# Patient Record
Sex: Male | Born: 1955 | Race: White | Hispanic: No | Marital: Single | Smoking: Current every day smoker
Health system: Southern US, Community
[De-identification: ages and names within clinical notes are randomized; demographics above are authoritative.]

---

## 2000-08-03 ENCOUNTER — Encounter: Payer: Self-pay | Admitting: Specialist

## 2000-08-03 ENCOUNTER — Ambulatory Visit (HOSPITAL_COMMUNITY): Admission: RE | Admit: 2000-08-03 | Discharge: 2000-08-03 | Payer: Self-pay | Admitting: Specialist

## 2005-05-20 ENCOUNTER — Ambulatory Visit: Payer: Self-pay | Admitting: Family Medicine

## 2005-06-11 ENCOUNTER — Ambulatory Visit: Payer: Self-pay | Admitting: Family Medicine

## 2005-06-23 ENCOUNTER — Ambulatory Visit: Payer: Self-pay | Admitting: Family Medicine

## 2005-07-05 ENCOUNTER — Ambulatory Visit: Payer: Self-pay | Admitting: Family Medicine

## 2005-07-26 ENCOUNTER — Ambulatory Visit: Payer: Self-pay | Admitting: Family Medicine

## 2005-08-13 ENCOUNTER — Ambulatory Visit: Payer: Self-pay | Admitting: Family Medicine

## 2010-07-18 ENCOUNTER — Inpatient Hospital Stay (INDEPENDENT_AMBULATORY_CARE_PROVIDER_SITE_OTHER)
Admission: RE | Admit: 2010-07-18 | Discharge: 2010-07-18 | Disposition: A | Discharge status: Home / Self Care | Payer: Managed Care, Other (non HMO) | Source: Ambulatory Visit | Attending: Emergency Medicine | Admitting: Emergency Medicine

## 2010-07-18 ENCOUNTER — Encounter: Payer: Self-pay | Admitting: Emergency Medicine

## 2010-07-18 DIAGNOSIS — K047 Periapical abscess without sinus: Secondary | ICD-10-CM

## 2010-07-18 DIAGNOSIS — K089 Disorder of teeth and supporting structures, unspecified: Secondary | ICD-10-CM

## 2010-07-23 NOTE — Assessment & Plan Note (Signed)
Summary: TOOTH INF./WSE   Vital Signs:  Patient Profile:   55 Years Old Male CC:      toothahe Height:     69 inches Weight:      188.8 pounds O2 Sat:      99 % O2 treatment:    Room Air Temp:     98.9 degrees F oral Pulse rate:   66 / minute Resp:     24 per minute BP sitting:   194 / 111  (left arm) Cuff size:   large  Vitals Entered By: Linton Flemings RN (July 18, 2010 4:12 PM)                  Updated Prior Medication List: No Medications Current Allergies: No known allergies History of Present Illness Chief Complaint: toothahe History of Present Illness: Toothache last 2 days.  Has a history of bad teeth, frequent infections and needs pain meds from his PCP.  He was told to call his oral surgeon this week and make an appt.  His pain is throbbing 7-8/10, L side mostly, with swelling.  No F/C/N/V.  For his HTN -- no CP, SOB, HA, dizziness, confusion, weakness, slurred speech.  REVIEW OF SYSTEMS Constitutional Symptoms      Denies fever, chills, night sweats, weight loss, weight gain, and fatigue.  Eyes       Denies change in vision, eye pain, eye discharge, glasses, contact lenses, and eye surgery. Ear/Nose/Throat/Mouth       Denies hearing loss/aids, change in hearing, ear pain, ear discharge, dizziness, frequent runny nose, frequent nose bleeds, sinus problems, sore throat, hoarseness, and tooth pain or bleeding.  Respiratory       Denies dry cough, productive cough, wheezing, shortness of breath, asthma, bronchitis, and emphysema/COPD.  Cardiovascular       Denies murmurs, chest pain, and tires easily with exhertion.    Gastrointestinal       Denies stomach pain, nausea/vomiting, diarrhea, constipation, blood in bowel movements, and indigestion. Genitourniary       Denies painful urination, kidney stones, and loss of urinary control. Neurological       Denies paralysis, seizures, and fainting/blackouts. Musculoskeletal       Denies muscle pain, joint  pain, joint stiffness, decreased range of motion, redness, swelling, muscle weakness, and gout.  Skin       Denies bruising, unusual mles/lumps or sores, and hair/skin or nail changes.  Psych       Denies mood changes, temper/anger issues, anxiety/stress, speech problems, depression, and sleep problems. Other Comments: toothache started this morning and pain has increased. states he is to have several teeth pulled Monday  Physical Exam General appearance: well developed, well nourished, mild distress Ears: normal, no lesions or deformities Nasal: mucosa pink, nonedematous, no septal deviation, turbinates normal Oral/Pharynx: see below Chest/Lungs: no rales, wheezes, or rhonchi bilateral, breath sounds equal without effort Heart: regular rate and  rhythm, no murmur MSE: oriented to time, place, and person Poor dentition.  Multiple cavities and likely abscesses with attached oral hardware.  Swelling L jaw with tenderness. Assessment New Problems: DENTAL PAIN (ICD-525.9) ABSCESS, TOOTH (ICD-522.5)   Plan New Medications/Changes: VICODIN 5-500 MG TABS (HYDROCODONE-ACETAMINOPHEN) 1 by mouth at bedtime as needed for pain  #10 x 0, 07/18/2010, Hoyt Koch MD IBUPROFEN 800 MG TABS (IBUPROFEN) 1 by mouth three times a day as needed for pain  #30 x 0, 07/18/2010, Hoyt Koch MD AUGMENTIN 875-125 MG TABS (AMOXICILLIN-POT CLAVULANATE) 1 by mouth  two times a day for 10 days  #20 x 0, 07/18/2010, Hoyt Koch MD  New Orders: New Patient Level III [phone] Services provided After hours-Weekends-Holidays [99051] Planning Comments:   I strongly urge him to see his oral surgeon to be evaluated for tooth extraction.  Also would like him to follow up with his PCP about his HTN.  Perhaps is just from the pain of the teeth, but may need medication for HTN.  ER precautions given for CP, SOB, dizziness, confusion, weakness, etc.  Will treat dental infx with Augmentin, Ibu 800, and Vicodin at  night only.  Last Vicodin Rx was 2 weeks ago by his PCP.  He usually gets #30 from his PCP monthly (Dr. Suzzette Righter in [place]), so will need to go there for a refill.  If more severe, go to ER.  Avoid hot and cold foods/liquids   The patient and/or caregiver has been counseled thoroughly with regard to medications prescribed including dosage, schedule, interactions, rationale for use, and possible side effects and they verbalize understanding.  Diagnoses and expected course of recovery discussed and will return if not improved as expected or if the condition worsens. Patient and/or caregiver verbalized understanding.  Prescriptions: VICODIN 5-500 MG TABS (HYDROCODONE-ACETAMINOPHEN) 1 by mouth at bedtime as needed for pain  #10 x 0   Entered and Authorized by:   Hoyt Koch MD   Signed by:   Hoyt Koch MD on 07/18/2010   Method used:   Print then Give to Patient   RxID:   [id number] IBUPROFEN 800 MG TABS (IBUPROFEN) 1 by mouth three times a day as needed for pain  #30 x 0   Entered and Authorized by:   Hoyt Koch MD   Signed by:   Hoyt Koch MD on 07/18/2010   Method used:   Print then Give to Patient   RxID:   [id number] AUGMENTIN 875-125 MG TABS (AMOXICILLIN-POT CLAVULANATE) 1 by mouth two times a day for 10 days  #20 x 0   Entered and Authorized by:   Hoyt Koch MD   Signed by:   Hoyt Koch MD on 07/18/2010   Method used:   Print then Give to Patient   RxID:   [id number]   Orders Added: 1)  New Patient Level III [01601] 2)  Services provided After hours-Weekends-Holidays [09323]

## 2013-12-30 ENCOUNTER — Emergency Department
Admission: EM | Admit: 2013-12-30 | Discharge: 2013-12-30 | Disposition: A | Discharge status: Home / Self Care | Payer: Self-pay | Source: Home / Self Care | Attending: Emergency Medicine | Admitting: Emergency Medicine

## 2013-12-30 ENCOUNTER — Emergency Department (INDEPENDENT_AMBULATORY_CARE_PROVIDER_SITE_OTHER): Payer: Self-pay

## 2013-12-30 ENCOUNTER — Encounter: Payer: Self-pay | Admitting: Emergency Medicine

## 2013-12-30 DIAGNOSIS — M25469 Effusion, unspecified knee: Secondary | ICD-10-CM

## 2013-12-30 DIAGNOSIS — M2340 Loose body in knee, unspecified knee: Secondary | ICD-10-CM

## 2013-12-30 DIAGNOSIS — M1711 Unilateral primary osteoarthritis, right knee: Secondary | ICD-10-CM

## 2013-12-30 MED ORDER — HYDROCODONE-ACETAMINOPHEN 5-325 MG PO TABS: 2.0000 | ORAL_TABLET | ORAL | Status: AC | PRN

## 2013-12-30 MED ORDER — MELOXICAM 7.5 MG PO TABS: 7.5000 mg | ORAL_TABLET | Freq: Every day | ORAL | Status: DC

## 2013-12-30 NOTE — ED Provider Notes (Signed)
CSN: [id number]     Arrival date & time 12/30/13  1252 History   First MD Initiated Contact with Patient 12/30/13 1335     Chief Complaint  Patient presents with  . Knee Pain    R   (Consider location/radiation/quality/duration/timing/severity/associated sxs/prior Treatment) Patient is a 58 y.o. male presenting with knee pain. The history is provided by the patient. No language interpreter was used.  Knee Pain Location:  Knee Time since incident:  3 months Injury: no   Knee location:  R knee Pain details:    Quality:  Aching and throbbing   Radiates to:  Does not radiate   Severity:  Moderate   Onset quality:  Gradual   Timing:  Constant   Progression:  Worsening Chronicity:  New Dislocation: no   Foreign body present:  No foreign bodies Prior injury to area:  No Relieved by:  Nothing Worsened by:  Nothing tried Ineffective treatments:  None tried Associated symptoms: no back pain   Risk factors: no concern for non-accidental trauma     History reviewed. No pertinent past medical history. History reviewed. No pertinent past surgical history. History reviewed. No pertinent family history. History  Substance Use Topics  . Smoking status: Current Every Day Smoker -- 0.50 packs/day  . Smokeless tobacco: Never Used  . Alcohol Use: Yes    Review of Systems  Musculoskeletal: Positive for joint swelling and myalgias. Negative for back pain.  All other systems reviewed and are negative.   Allergies  Review of patient's allergies indicates no known allergies.  Home Medications   Prior to Admission medications   Medication Sig Start Date End Date Taking? Authorizing Provider  acetaminophen (TYLENOL) 325 MG tablet Take 650 mg by mouth every 6 (six) hours as needed.   Yes Historical Provider, MD   BP 130/75  Pulse 73  Temp(Src) 98.6 F (37 C) (Oral)  Ht  (1.753 m)  Wt 168 lb (76.204 kg)  BMI 24.80 kg/m2  SpO2 100% Physical Exam  Nursing note and vitals  reviewed. Constitutional: He is oriented to person, place, and time. He appears well-developed and well-nourished.  HENT:  Head: Normocephalic.  Eyes: EOM are normal.  Neck: Normal range of motion.  Pulmonary/Chest: Effort normal.  Abdominal: He exhibits no distension.  Musculoskeletal:  Swollen right knee,  Looks arthritic. Pain with range of motion, nv and ns intact  Neurological: He is alert and oriented to person, place, and time.  Psychiatric: He has a normal mood and affect.    ED Course  Procedures (including critical care time) Labs Review Labs Reviewed - No data to display  Imaging Review Dg Knee Complete 4 Views Right  12/30/2013   CLINICAL DATA:  Right knee pain for 2 months.  No known injury.  EXAM: RIGHT KNEE - COMPLETE 4+ VIEW  COMPARISON:  None.  FINDINGS: No fracture or dislocation. There is a punctate (approximately 0.7 cm) loose body within the posterior aspect of the lateral joint space of the knee. A discrete donor site is not identified. Moderate to severe tricompartmental degenerative change, worse with the medial compartment with joint space loss, articular surface irregularity, subchondral sclerosis and osteophytosis. No evidence of chondrocalcinosis. Small to moderate-sized joint effusion. Vascular calcifications. Regional soft tissues appear normal.  IMPRESSION: 1. Small to moderate-sized joint effusion. Otherwise, no acute findings. 2. Moderate to severe tricompartmental degenerative change of the knee, worse within the medial compartment. 3. Punctate (approximately 0.7 cm) loose body within the posterior lateral aspect  of the knee joint space. A discrete donor site is not identified. Further evaluation with knee MRI could be performed as clinically indicated.   Electronically Signed   By: Simonne Come M.D.   On: 12/30/2013 14:03     MDM   1. Osteoarthritis of right knee, unspecified osteoarthritis type     Pt counseled on loose body and severity of arthritis.    I will treat pt's pain and try meloxicam for inflammation.   Pt referred to Dr. Karie Schwalbe.   He may need MRi.      Lonia Skinner Kent, PA-C 12/30/13 1513

## 2013-12-30 NOTE — Discharge Instructions (Signed)
Arthritis, Nonspecific °Arthritis is inflammation of a joint. This usually means pain, redness, warmth or swelling are present. One or more joints may be involved. There are a number of types of arthritis. Your caregiver may not be able to tell what type of arthritis you have right away. °CAUSES  °The most common cause of arthritis is the wear and tear on the joint (osteoarthritis). This causes damage to the cartilage, which can break down over time. The knees, hips, back and neck are most often affected by this type of arthritis. °Other types of arthritis and common causes of joint pain include: °· Sprains and other injuries near the joint. Sometimes minor sprains and injuries cause pain and swelling that develop hours later. °· Rheumatoid arthritis. This affects hands, feet and knees. It usually affects both sides of your body at the same time. It is often associated with chronic ailments, fever, weight loss and general weakness. °· Crystal arthritis. Gout and pseudo gout can cause occasional acute severe pain, redness and swelling in the foot, ankle, or knee. °· Infectious arthritis. Bacteria can get into a joint through a break in overlying skin. This can cause infection of the joint. Bacteria and viruses can also spread through the blood and affect your joints. °· Drug, infectious and allergy reactions. Sometimes joints can become mildly painful and slightly swollen with these types of illnesses. °SYMPTOMS  °· Pain is the main symptom. °· Your joint or joints can also be red, swollen and warm or hot to the touch. °· You may have a fever with certain types of arthritis, or even feel overall ill. °· The joint with arthritis will hurt with movement. Stiffness is present with some types of arthritis. °DIAGNOSIS  °Your caregiver will suspect arthritis based on your description of your symptoms and on your exam. Testing may be needed to find the type of arthritis: °· Blood and sometimes urine tests. °· X-ray tests  and sometimes CT or MRI scans. °· Removal of fluid from the joint (arthrocentesis) is done to check for bacteria, crystals or other causes. Your caregiver (or a specialist) will numb the area over the joint with a local anesthetic, and use a needle to remove joint fluid for examination. This procedure is only minimally uncomfortable. °· Even with these tests, your caregiver may not be able to tell what kind of arthritis you have. Consultation with a specialist (rheumatologist) may be helpful. °TREATMENT  °Your caregiver will discuss with you treatment specific to your type of arthritis. If the specific type cannot be determined, then the following general recommendations may apply. °Treatment of severe joint pain includes: °· Rest. °· Elevation. °· Anti-inflammatory medication (for example, ibuprofen) may be prescribed. Avoiding activities that cause increased pain. °· Only take over-the-counter or prescription medicines for pain and discomfort as recommended by your caregiver. °· Cold packs over an inflamed joint may be used for 10 to 15 minutes every hour. Hot packs sometimes feel better, but do not use overnight. Do not use hot packs if you are diabetic without your caregiver's permission. °· A cortisone shot into arthritic joints may help reduce pain and swelling. °· Any acute arthritis that gets worse over the next 1 to 2 days needs to be looked at to be sure there is no joint infection. °Long-term arthritis treatment involves modifying activities and lifestyle to reduce joint stress jarring. This can include weight loss. Also, exercise is needed to nourish the joint cartilage and remove waste. This helps keep the muscles   around the joint strong. °HOME CARE INSTRUCTIONS  °· Do not take aspirin to relieve pain if gout is suspected. This elevates uric acid levels. °· Only take over-the-counter or prescription medicines for pain, discomfort or fever as directed by your caregiver. °· Rest the joint as much as  possible. °· If your joint is swollen, keep it elevated. °· Use crutches if the painful joint is in your leg. °· Drinking plenty of fluids may help for certain types of arthritis. °· Follow your caregiver's dietary instructions. °· Try low-impact exercise such as: °¨ Swimming. °¨ Water aerobics. °¨ Biking. °¨ Walking. °· Morning stiffness is often relieved by a warm shower. °· Put your joints through regular range-of-motion. °SEEK MEDICAL CARE IF:  °· You do not feel better in 24 hours or are getting worse. °· You have side effects to medications, or are not getting better with treatment. °SEEK IMMEDIATE MEDICAL CARE IF:  °· You have a fever. °· You develop severe joint pain, swelling or redness. °· Many joints are involved and become painful and swollen. °· There is severe back pain and/or leg weakness. °· You have loss of bowel or bladder control. °Document Released: 05/20/2004 Document Revised: 07/05/2011 Document Reviewed: 06/05/2008 °[employer]® Patient Information ©2015 [employer]. This information is not intended to replace advice given to you by your health care provider. Make sure you discuss any questions you have with your health care provider. ° °

## 2013-12-30 NOTE — ED Notes (Signed)
Pt here for R knee pain he has been experiencing for approx 2 months.  The pain is 8/10 and the knee has been swelling on and off.  He has no known injury and has not had any imaging done.  Takes Tylenol for pain and wears a knee sleeve daily.

## 2014-01-04 NOTE — ED Provider Notes (Signed)
Medical history/examination/treatment/procedure(s) were performed by non-physician provider and as supervising physician I was immediately available for consultation/collaboration.   Lajean Manes, MD 01/04/14 [phone]

## 2014-02-16 ENCOUNTER — Emergency Department
Admission: EM | Admit: 2014-02-16 | Discharge: 2014-02-16 | Disposition: A | Discharge status: Home / Self Care | Payer: Self-pay | Source: Home / Self Care | Attending: Emergency Medicine | Admitting: Emergency Medicine

## 2014-02-16 ENCOUNTER — Encounter: Payer: Self-pay | Admitting: Emergency Medicine

## 2014-02-16 DIAGNOSIS — M1711 Unilateral primary osteoarthritis, right knee: Secondary | ICD-10-CM

## 2014-02-16 DIAGNOSIS — M179 Osteoarthritis of knee, unspecified: Secondary | ICD-10-CM

## 2014-02-16 DIAGNOSIS — M25561 Pain in right knee: Secondary | ICD-10-CM

## 2014-02-16 MED ORDER — MELOXICAM 15 MG PO TABS: 15.0000 mg | ORAL_TABLET | Freq: Every day | ORAL | Status: AC

## 2014-02-16 NOTE — ED Provider Notes (Signed)
CSN: [id number]     Arrival date & time 02/16/14  1400 History   First MD Initiated Contact with Patient 02/16/14 1415     Chief Complaint  Patient presents with  . Knee Pain    HPI Has chronic right knee pain. 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 He recalls no specific injury. Complains of moderate to severe right knee pain chronically, and requests refill of Vicodin , #20 was prescribed by Campbell LernerLeslie Sophia here on 12/30/2013. He specifically requests a rx for Vicodin He states he does not have a PCP and can't afford one. Last month, he was referred to Dr. Benjamin Stainhekkekandam for evaluation of a chronic right knee pain, but he states he couldn't keep that appointment because "I can't afford it"  Denies chest pain, palpitations, shortness of breath, syncope, or focal neurologic symptoms No past medical history on file. No past surgical history on file. No family history on file. History  Substance Use Topics  . Smoking status: Current Every Day Smoker -- 0.50 packs/day  . Smokeless tobacco: Never Used  . Alcohol Use: Yes    Review of Systems  All other systems reviewed and are negative.   Allergies  Review of patient's allergies indicates no known allergies.  Home Medications   Prior to Admission medications   Medication Sig Start Date End Date Taking? Authorizing Provider  acetaminophen (TYLENOL) 325 MG tablet Take 650 mg by mouth every 6 (six) hours as needed.    Historical Provider, MD  HYDROcodone-acetaminophen (NORCO/VICODIN) 5-325 MG per tablet Take 2 tablets by mouth every 4 (four) hours as needed. 12/30/13   Elson AreasLeslie K Sofia, PA-C  meloxicam (MOBIC) 15 MG tablet Take 1 tablet (15 mg total) by mouth daily. As needed for pain and inflammation. Take with food. 02/16/14   Lajean Manesavid Massey, MD   Physical Exam  Nursing note and vitals reviewed. Constitutional: He is oriented to person, place, and time. He appears  well-developed and well-nourished. No distress.  I rechecked BP 164/80  HENT:  Head: Normocephalic and atraumatic.  Eyes: Conjunctivae and EOM are normal. Pupils are equal, round, and reactive to light. No scleral icterus.  Neck: Normal range of motion.  Cardiovascular: Normal rate.   Pulmonary/Chest: Effort normal.  Abdominal: He exhibits no distension.  Musculoskeletal: Normal range of motion.  Chronically swollen, degenerative arthritic right knee with slightly decreased range of motion. Some pain on deep palpation diffusely. Some pain when he tries to do a squat. No instability. No heat or redness or fluctuance or any skin change. No red streaks. Neurovascular distally intact.  Neurological: He is alert and oriented to person, place, and time.  Skin: Skin is warm.  Psychiatric: He has a normal mood and affect.    ED Course  Procedures (including critical care time) Labs Review Labs Reviewed - No data to display  Imaging Review 12/30/2013 CLINICAL DATA: Right knee pain for 2 months. No known injury. EXAM: RIGHT KNEE - COMPLETE 4+ VIEW COMPARISON: None. FINDINGS: No fracture or dislocation. There is a punctate (approximately 0.7 cm) loose body within the posterior aspect of the lateral joint space of the knee. A discrete donor site is not identified. Moderate to severe tricompartmental degenerative change, worse with the medial compartment with joint space loss, articular surface irregularity, subchondral sclerosis and osteophytosis. No evidence of chondrocalcinosis. Small to moderate-sized joint effusion. Vascular calcifications. Regional soft tissues appear normal. IMPRESSION: 1. Small to moderate-sized joint  effusion. Otherwise, no acute findings. 2. Moderate to severe tricompartmental degenerative change of the knee, worse within the medial compartment. 3. Punctate (approximately 0.7 cm) loose body within the posterior lateral aspect of the knee joint space. A discrete donor site is not  identified. Further evaluation with knee MRI could be performed as clinically indicated. Electronically Signed By: Simonne ComeJohn Watts M.D. On: 12/30/2013 14:03     MDM   1. Right knee pain   2. Osteoarthritis of right knee, unspecified osteoarthritis type    chronic right knee pain. [facility].--He's had 11 total prescriptions for hydrocodone/acetaminophen in the past 12 months, and at least 30 prescriptions for tramadol in the past 12 months. I confronted him with this printout and he admits he's needed strong pain medicine for his chronic right knee pain. I explained to him them quite concerned about his controlled medication seeking behavior, and I explained that we will not prescribe him Vicodin or any controlled drugs. I urged him to see an orthopedist and gave him names and phone numbers, and I explained risks of not doing so. Also advised him to establish with a PCP for ongoing preventive care and to recheck BP which is elevated today.--I explained risks of not doing so. New Prescriptions   MELOXICAM (MOBIC) 15 MG TABLET    Take 1 tablet (15 mg total) by mouth daily. As needed for pain and inflammation. Take with food.    Precautions discussed. Red flags discussed. Questions invited and answered. Patient voiced understanding and agreement.     Lajean Manesavid Massey, MD 02/16/14 [phone]

## 2014-02-16 NOTE — ED Notes (Signed)
Reports return of right knee pain; evaluated 12-30-2013 for same with xrays> revealed arthritis. Took tylenol at noon today.

## 2014-02-17 ENCOUNTER — Telehealth: Payer: Self-pay | Admitting: Family Medicine

## 2014-02-17 MED ORDER — DICLOFENAC SODIUM 75 MG PO TBEC: 75.0000 mg | DELAYED_RELEASE_TABLET | Freq: Two times a day (BID) | ORAL | Status: AC | PRN

## 2014-02-17 NOTE — ED Notes (Signed)
Patient called noting that Mobic is not effective.  Review of chart shows multiple rx for narcotics and note from yesterday shows a conversation stating no more narcotics.  Will switch to Voltaren. F/u with PCP RN to contact pt.   Rodolph BongEvan S Obediah Welles, MD 02/17/14 [phone]

## 2015-08-25 IMAGING — CR DG KNEE COMPLETE 4+V*R*
4 series · 4 of 4 positions shown · non-contrast
Comparison: None.

CLINICAL DATA: Right knee pain for 2 months.  No known injury.

EXAM:
RIGHT KNEE - COMPLETE 4+ VIEW

[view not recorded (1 of 4)]
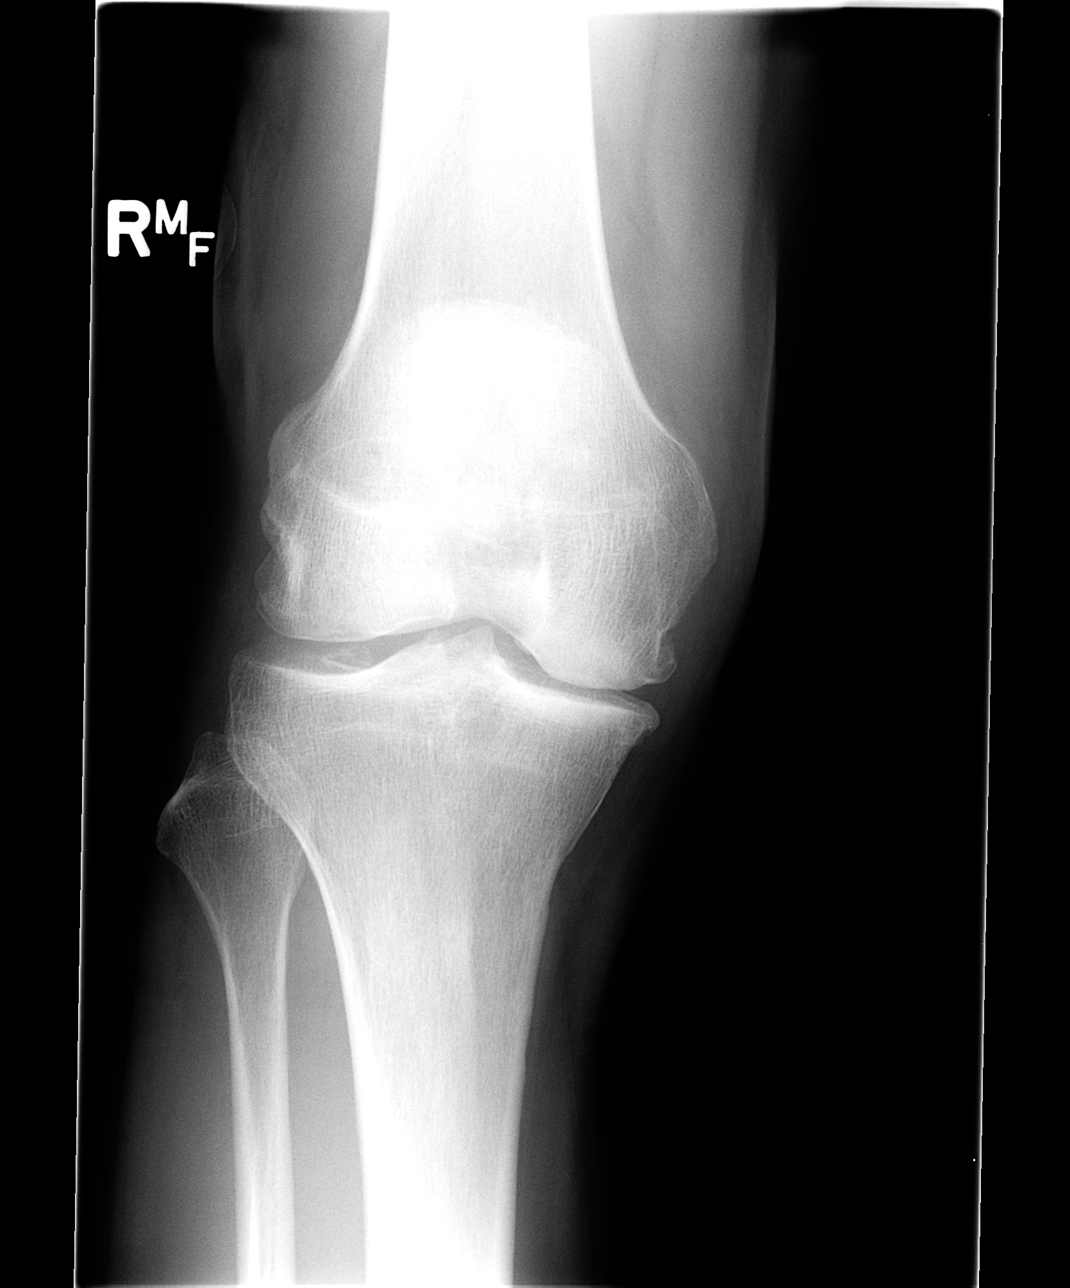

[view not recorded (2 of 4)]
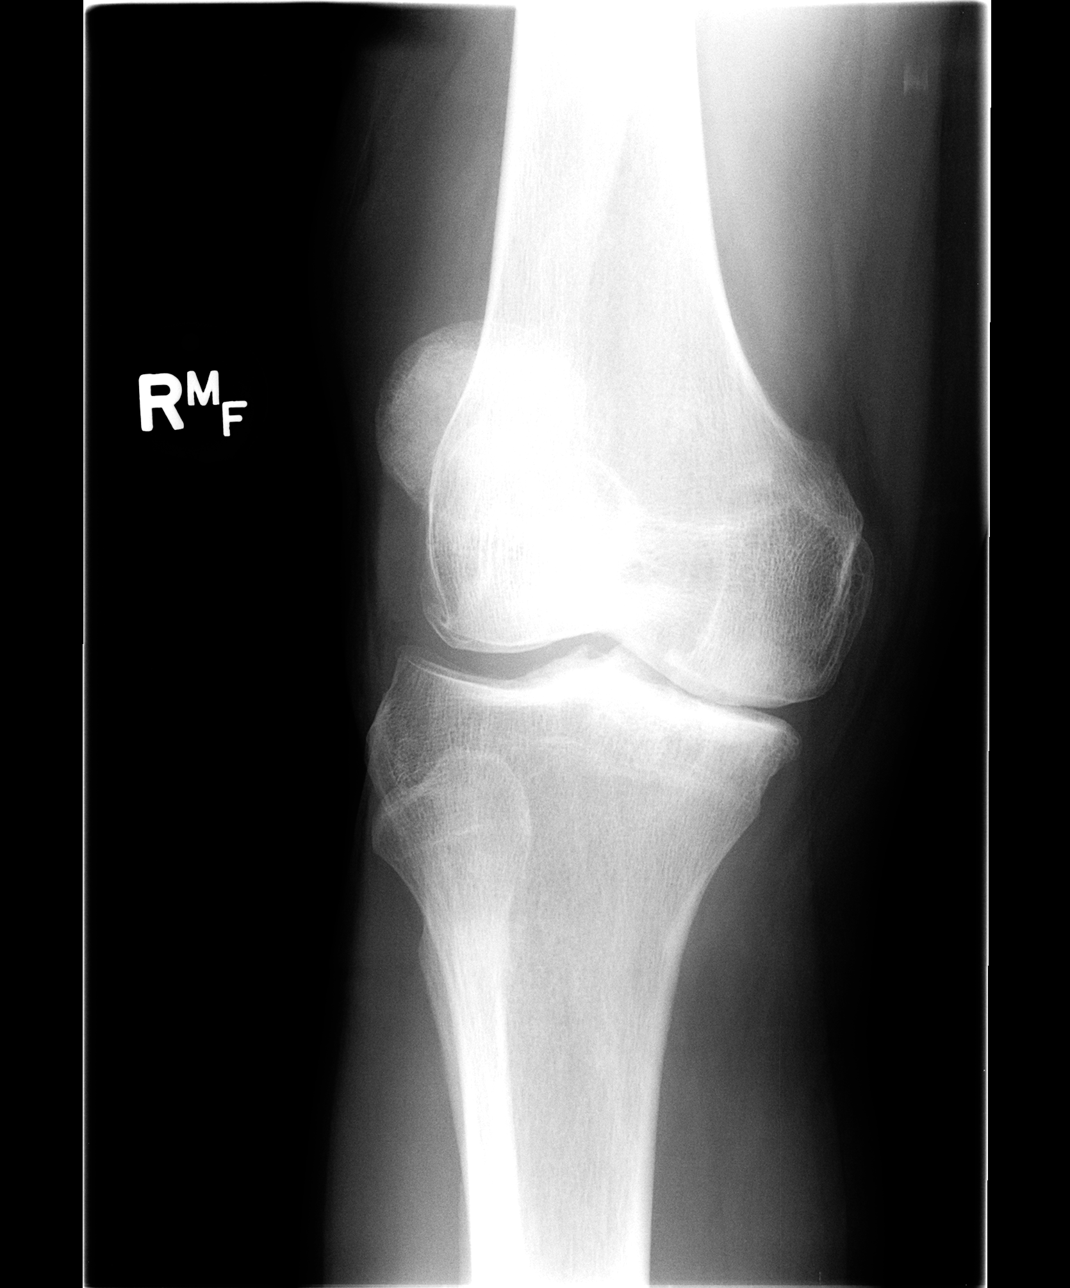

[view not recorded (3 of 4)]
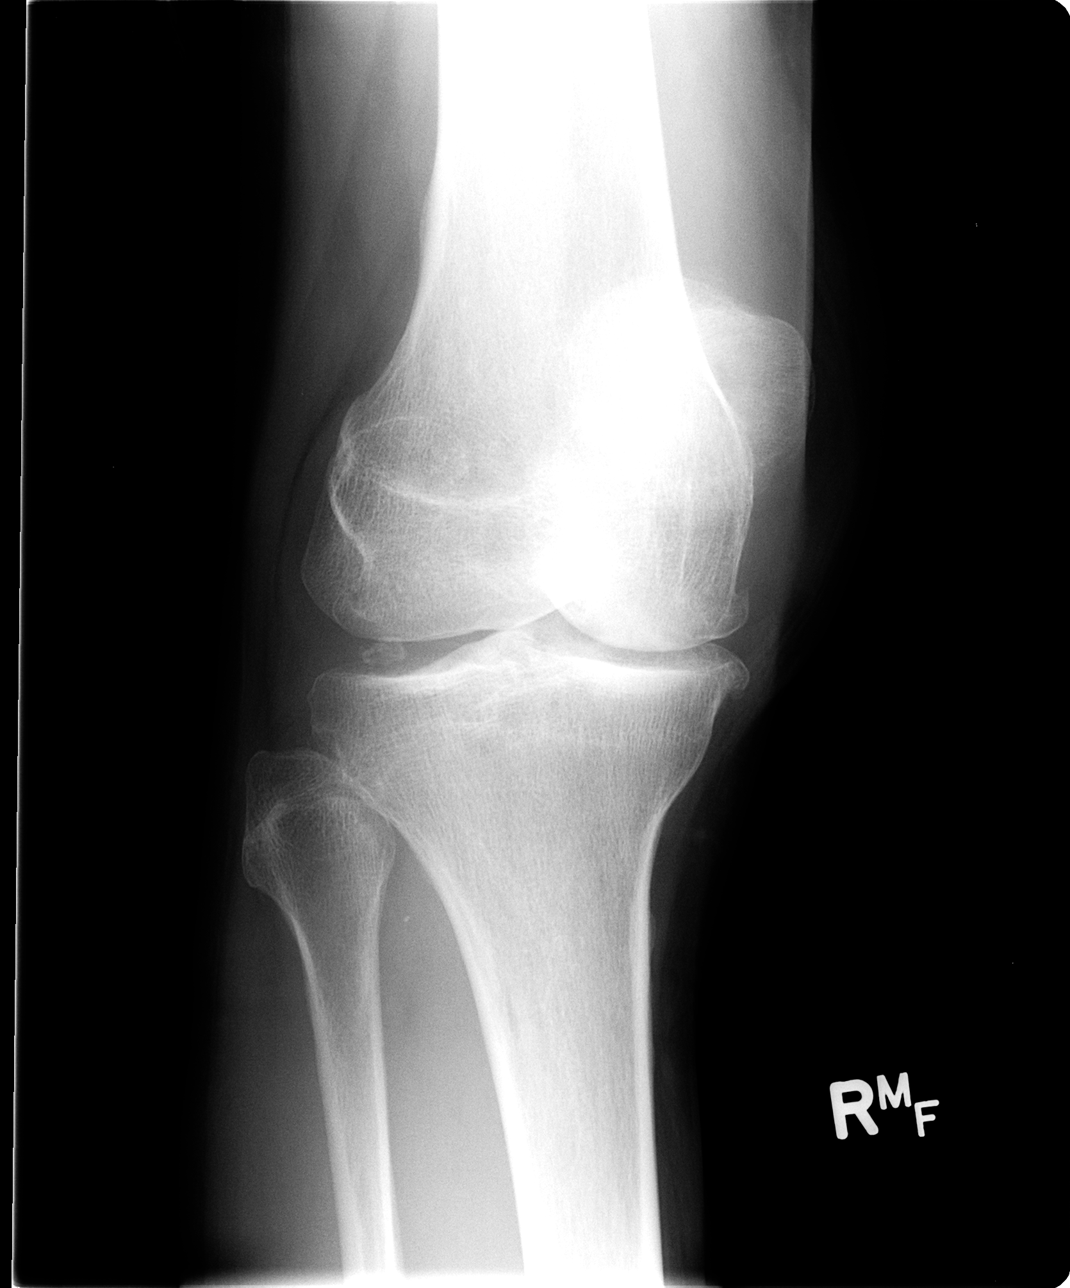

[view not recorded (4 of 4)]
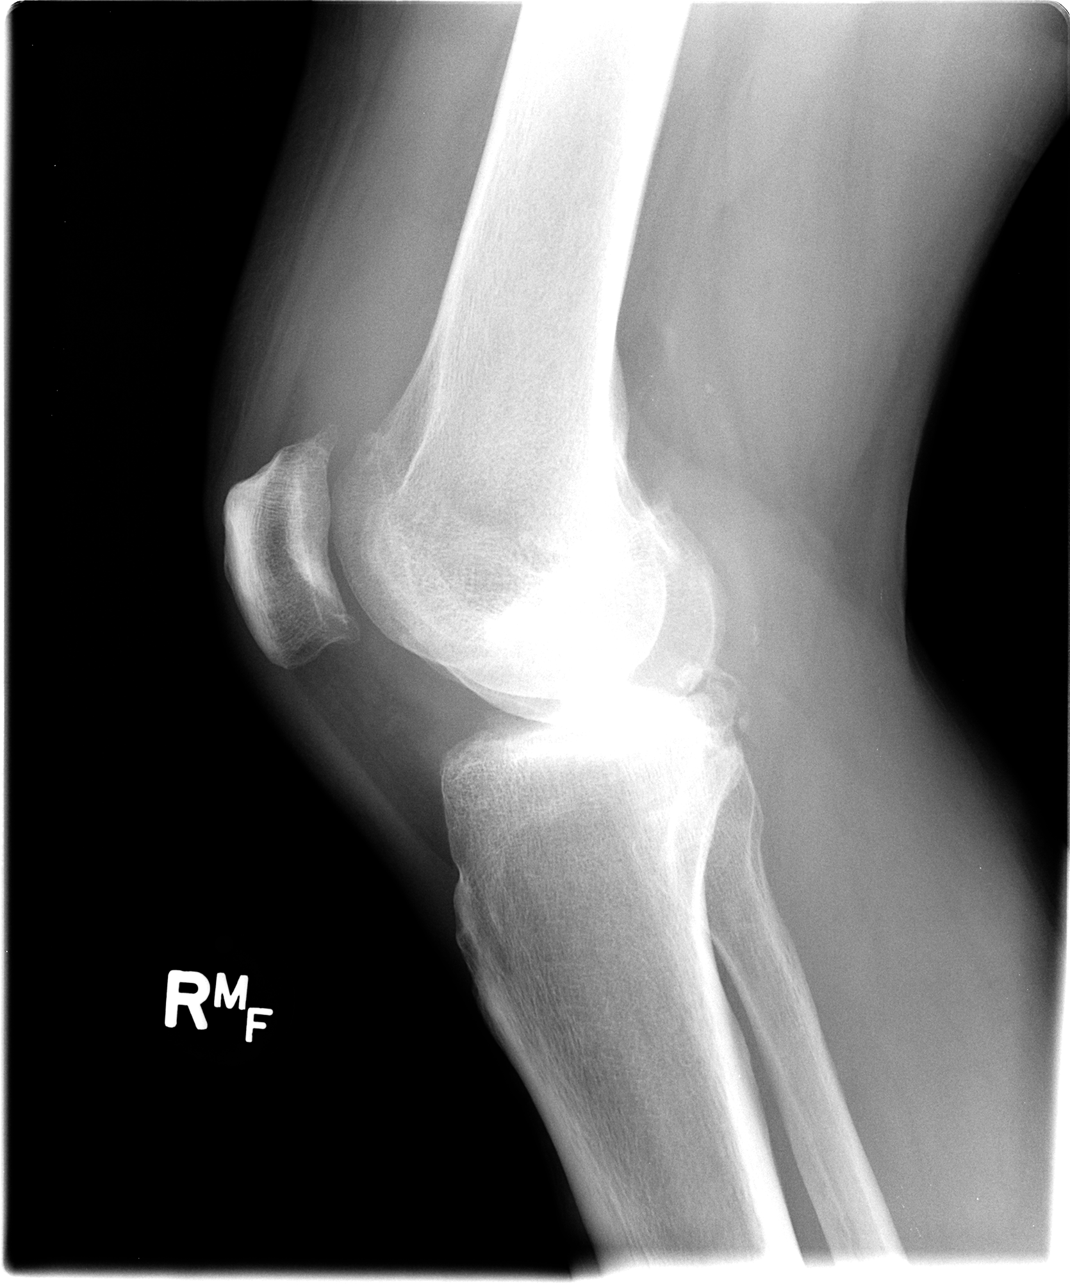

[4 of 4 positions shown; findings below may reference images not displayed]

FINDINGS: No fracture or dislocation. There is a punctate (approximately
cm) loose body within the posterior aspect of the lateral joint
space of the knee. A discrete donor site is not identified. Moderate
to severe tricompartmental degenerative change, worse with the
medial compartment with joint space loss, articular surface
irregularity, subchondral sclerosis and osteophytosis. No evidence
of chondrocalcinosis. Small to moderate-sized joint effusion.
Vascular calcifications. Regional soft tissues appear normal.
IMPRESSION: 1. Small to moderate-sized joint effusion. Otherwise, no acute
findings.
2. Moderate to severe tricompartmental degenerative change of the
knee, worse within the medial compartment.
3. Punctate (approximately 0.7 cm) loose body within the posterior
lateral aspect of the knee joint space. A discrete donor site is not
identified. Further evaluation with knee MRI could be performed as
clinically indicated.
